# Patient Record
Sex: Male | Born: 1965 | Race: White | Hispanic: No | Marital: Married | Smoking: Never smoker
Health system: Southern US, Community
[De-identification: ages and names within clinical notes are randomized; demographics above are authoritative.]

## PROBLEM LIST (undated history)

## (undated) DIAGNOSIS — G4733 Obstructive sleep apnea (adult) (pediatric): Secondary | ICD-10-CM

## (undated) HISTORY — PX: CHOLECYSTECTOMY: SHX55

---

## 2019-12-22 ENCOUNTER — Other Ambulatory Visit: Payer: Self-pay

## 2019-12-22 ENCOUNTER — Encounter (HOSPITAL_BASED_OUTPATIENT_CLINIC_OR_DEPARTMENT_OTHER): Payer: Self-pay

## 2019-12-22 ENCOUNTER — Emergency Department (HOSPITAL_BASED_OUTPATIENT_CLINIC_OR_DEPARTMENT_OTHER)
Admission: EM | Admit: 2019-12-22 | Discharge: 2019-12-22 | Disposition: A | Discharge status: Home / Self Care | Payer: Commercial Managed Care - PPO | Attending: Emergency Medicine | Admitting: Emergency Medicine

## 2019-12-22 ENCOUNTER — Emergency Department (HOSPITAL_BASED_OUTPATIENT_CLINIC_OR_DEPARTMENT_OTHER): Payer: Commercial Managed Care - PPO

## 2019-12-22 DIAGNOSIS — M79605 Pain in left leg: Secondary | ICD-10-CM

## 2019-12-22 DIAGNOSIS — M25552 Pain in left hip: Secondary | ICD-10-CM | POA: Diagnosis present

## 2019-12-22 DIAGNOSIS — M79662 Pain in left lower leg: Secondary | ICD-10-CM | POA: Diagnosis not present

## 2019-12-22 HISTORY — DX: Obstructive sleep apnea (adult) (pediatric): G47.33

## 2019-12-22 LAB — CBC
HCT: 44.2 % (ref 39.0–52.0)
Hemoglobin: 14.9 g/dL (ref 13.0–17.0)
MCH: 30.2 pg (ref 26.0–34.0)
MCHC: 33.7 g/dL (ref 30.0–36.0)
MCV: 89.5 fL (ref 80.0–100.0)
Platelets: 174 10*3/uL (ref 150–400)
RBC: 4.94 MIL/uL (ref 4.22–5.81)
RDW: 12.1 % (ref 11.5–15.5)
WBC: 7 10*3/uL (ref 4.0–10.5)
nRBC: 0 % (ref 0.0–0.2)

## 2019-12-22 LAB — BASIC METABOLIC PANEL
Anion gap: 9 (ref 5–15)
BUN: 14 mg/dL (ref 6–20)
CO2: 25 mmol/L (ref 22–32)
Calcium: 8.7 mg/dL — ABNORMAL LOW (ref 8.9–10.3)
Chloride: 100 mmol/L (ref 98–111)
Creatinine, Ser: 0.79 mg/dL (ref 0.61–1.24)
GFR calc Af Amer: >60 mL/min (ref >60)
GFR calc non Af Amer: >60 mL/min (ref >60)
Glucose, Bld: 118 mg/dL — ABNORMAL HIGH (ref 70–99)
Potassium: 4 mmol/L (ref 3.5–5.1)
Sodium: 134 mmol/L — ABNORMAL LOW (ref 135–145)

## 2019-12-22 MED ORDER — IOHEXOL 350 MG/ML SOLN
100.0000 mL | Freq: Once | INTRAVENOUS | Status: AC | PRN
  Administered 2019-12-22: 100 mL via INTRAVENOUS

## 2019-12-22 MED ORDER — IBUPROFEN 600 MG PO TABS: 600.0000 mg | ORAL_TABLET | Freq: Four times a day (QID) | ORAL | 0 refills | Status: AC | PRN

## 2019-12-22 MED ORDER — METHYLPREDNISOLONE 4 MG PO TBPK
ORAL_TABLET | ORAL | 0 refills | Status: AC
Start: 2019-12-22 — End: ?

## 2019-12-22 MED ORDER — KETOROLAC TROMETHAMINE 30 MG/ML IJ SOLN
30.0000 mg | Freq: Once | INTRAMUSCULAR | Status: AC
  Administered 2019-12-22: 30 mg via INTRAVENOUS
  Filled 2019-12-22: qty 1

## 2019-12-22 MED ORDER — OXYCODONE-ACETAMINOPHEN 5-325 MG PO TABS: 2.0000 | ORAL_TABLET | Freq: Four times a day (QID) | ORAL | 0 refills | Status: AC | PRN

## 2019-12-22 NOTE — ED Notes (Signed)
Patient stated that the pain is worst than when he came.  Patient just got back from xray.

## 2019-12-22 NOTE — Discharge Instructions (Addendum)
Please follow up with your PCP in 1 week.  I've prescribed steroids, NSAIDS, and percocet for your pain.  Your condition may be a torn muscle, or else an inflamed nerve in your back, or another medical issue that we could not determine in the ER today.  Your CT scan today did not show any injuries or issues with your blood vessels, including your aorta in the abdomen or the major blood vessels in your legs.  This was my primary concern with your presentation.  Your doctor may wish to pursue additional testing including an MRI of your back if you are not improving.  Please bring your lab tests and CT report with you to the doctor's office on your next visit.

## 2019-12-22 NOTE — ED Notes (Signed)
Off unit for CT scan.

## 2019-12-22 NOTE — ED Triage Notes (Signed)
Pt arrives with c/o pain to left leg starting Saturday. Pt erepots he walks 2 miles everyday. States that hi [place] is in his left hip and radiates into left knee. Pt reports some numbness to left knee with pain.

## 2019-12-22 NOTE — ED Notes (Signed)
ED Provider at bedside. 

## 2019-12-22 NOTE — ED Provider Notes (Signed)
[facility] EMERGENCY DEPARTMENT Provider Note   CSN: [id number] Arrival date & time: 12/22/19  1018     History Chief Complaint  Patient presents with  . Leg Pain    Dale Williamson is a 54 y.o. male presenting to the emergency department with left hip and upper leg pain.  Patient reports that he had some aching in his bilateral legs 2 days ago, normally walks about 2 miles every day.  He went to bed last night feeling okay but woke up this morning with severe pain deep in his left hip.  He feels like it starts near the hip and radiates down to his mid thigh.  He says the pain was so severe he could not get out of bed.  He felt he could barely speak because the pain was so intense.  His wife reports he has never seen him in this much pain.  They tried some home remedies including ice packs and lidocaine jelly.  These have not provided much relief.    Patient feels that his pain is better when laying still.  It is worse when trying to move in with bearing weight.  Overall the pain is somewhat improved from its onset this morning., now a 7/10 instead of 10/10.  He has never had pain like this before.  He denies any history of back injuries, slipped disc, or spinal surgery.  He denies any known history of arthritis.  He denies any history of blood clots in his lower legs.  He denies any smoking history.  He denies any history of aneurysm in himself or his family.    HPI     Past Medical History:  Diagnosis Date  . Obstructive sleep apnea on CPAP     There are no problems to display for this patient.   Past Surgical History:  Procedure Laterality Date  . CHOLECYSTECTOMY         No family history on file.  Social History   Tobacco Use  . Smoking status: Never Smoker  . Smokeless tobacco: Never Used  Substance Use Topics  . Alcohol use: Not Currently  . Drug use: Not Currently    Home Medications Prior to Admission medications   Medication Sig Start Date End  Date Taking? Authorizing Provider  acetaminophen (TYLENOL) 500 MG tablet Take 1,000 mg by mouth every 6 (six) hours as needed.   Yes [provider]  ibuprofen (ADVIL) 600 MG tablet Take 1 tablet (600 mg total) by mouth every 6 (six) hours as needed for up to 30 doses. 12/22/19   Terald Sleeper, MD  methylPREDNISolone (MEDROL DOSEPAK) 4 MG TBPK tablet Use as directed 12/22/19   Terald Sleeper, MD  oxyCODONE-acetaminophen (PERCOCET/ROXICET) 5-325 MG tablet Take 2 tablets by mouth every 6 (six) hours as needed for up to 5 doses for severe pain. 12/22/19   Terald Sleeper, MD    Allergies    Patient has no known allergies.  Review of Systems   Review of Systems  Constitutional: Negative for chills and fever.  HENT: Negative for ear pain and sore throat.   Eyes: Negative for pain and visual disturbance.  Respiratory: Negative for cough and shortness of breath.   Cardiovascular: Negative for chest pain and palpitations.  Gastrointestinal: Negative for nausea and vomiting.  Genitourinary: Negative for dysuria and hematuria.  Musculoskeletal: Positive for arthralgias and myalgias.  Skin: Negative for color change and rash.  Neurological: Positive for numbness. Negative for syncope and  weakness.  Psychiatric/Behavioral: Negative for agitation and confusion.  All other systems reviewed and are negative.   Physical Exam Updated Vital Signs BP 113/76 (BP Location: Right Arm)   Pulse 69   Temp 97.9 F (36.6 C) (Oral)   Resp 19   Ht 5\' 7"  (1.702 m)   Wt 73.9 kg   SpO2 100%   BMI 25.53 kg/m   Physical Exam Vitals and nursing note reviewed.  Constitutional:      Appearance: He is well-developed.  HENT:     Head: Normocephalic and atraumatic.  Eyes:     Conjunctiva/sclera: Conjunctivae normal.  Cardiovascular:     Rate and Rhythm: Normal rate and regular rhythm.     Pulses: Normal pulses.     Heart sounds: No murmur heard.      Comments: Femoral and pedal pulses  equal bilaterally Pulmonary:     Effort: Pulmonary effort is normal. No respiratory distress.     Breath sounds: Normal breath sounds.  Abdominal:     General: There is no distension.     Palpations: Abdomen is soft.     Tenderness: There is no abdominal tenderness. There is no guarding.  Musculoskeletal:     Cervical back: Neck supple.     Comments: No unilateral swelling of the lower extremities No edema, bruising, or tenderness of the gluteal or lower extremity musculature  Skin:    General: Skin is warm and dry.  Neurological:     General: No focal deficit present.     Mental Status: He is alert and oriented to person, place, and time.     Comments: Paresthesias reported in lateral hip L1-L2 dermatome left side No saddle anesthesia 5/5 strength in lower extremities at the hip, knee, and ankle and toes Reflexes normal Negative straight leg test No spinal midline tenderness     ED Results / Procedures / Treatments   Labs (all labs ordered are listed, but only abnormal results are displayed) Labs Reviewed  BASIC METABOLIC PANEL - Abnormal; Notable for the following components:      Result Value   Sodium 134 (*)    Glucose, Bld 118 (*)    Calcium 8.7 (*)    All other components within normal limits  CBC    EKG None  Radiology CT ANGIO AO+BIFEM W & OR WO CONTRAST  Result Date: 12/22/2019 CLINICAL DATA:  54 year old with left leg pain. Need to rule out abdominal aortic aneurysm. EXAM: CT ANGIOGRAPHY OF ABDOMINAL AORTA WITH ILIOFEMORAL RUNOFF TECHNIQUE: Multidetector CT imaging of the abdomen, pelvis and lower extremities was performed using the standard protocol during bolus administration of intravenous contrast. Multiplanar CT image reconstructions and MIPs were obtained to evaluate the vascular anatomy. CONTRAST:  136mL OMNIPAQUE IOHEXOL 350 MG/ML SOLN COMPARISON:  None. FINDINGS: VASCULAR Aorta: Normal caliber aorta without aneurysm, dissection, vasculitis or  significant stenosis. Celiac: Patent without evidence of aneurysm, dissection, vasculitis or significant stenosis. SMA: Patent without evidence of aneurysm, dissection, vasculitis or significant stenosis. Renals: Both renal arteries are patent without evidence of aneurysm, dissection, vasculitis, fibromuscular dysplasia or significant stenosis. IMA: Patent without evidence of aneurysm, dissection, vasculitis or significant stenosis. RIGHT Lower Extremity Inflow: Common, internal and external iliac arteries are patent without evidence of aneurysm, dissection, vasculitis or significant stenosis. Outflow: Common, superficial and profunda femoral arteries and the popliteal artery are patent without evidence of aneurysm, dissection, vasculitis or significant stenosis. Runoff: Patent three vessel runoff to the ankle. LEFT Lower Extremity Inflow: Common, internal and  external iliac arteries are patent without evidence of aneurysm, dissection, vasculitis or significant stenosis. Outflow: Common, superficial and profunda femoral arteries and the popliteal artery are patent without evidence of aneurysm, dissection, vasculitis or significant stenosis. Runoff: Patent three vessel runoff to the ankle. Veins: No obvious venous abnormality within the limitations of this arterial phase study. Review of the MIP images confirms the above findings. NON-VASCULAR Lower chest: 4 mm pleural-based density in the posterior left lower lobe is probably an incidental finding and could represent atelectasis or tiny nodule. This is likely an incidental finding. No significant pleural fluid. Hepatobiliary: Cholecystectomy.  Normal appearance of the liver. Pancreas: Unremarkable. No pancreatic ductal dilatation or surrounding inflammatory changes. Spleen: Normal in size without focal abnormality. Adrenals/Urinary Tract: Normal appearance of the adrenal glands. Normal appearance of both kidneys without hydronephrosis or suspicious lesions. Urinary  bladder is unremarkable. Stomach/Bowel: Moderate to large amount of stool throughout the colon, particularly in the right colon and rectal region. No evidence for bowel obstruction or focal bowel inflammation. Normal appearance of stomach. Lymphatic: No significant abdominopelvic lymphadenopathy. Reproductive: Prostate is unremarkable. Other: Negative for ascites. Negative for free air. Small umbilical hernia containing fat. Musculoskeletal: Disc space narrowing at L5-S1. IMPRESSION: VASCULAR Normal arterial structures in the abdomen, pelvis and lower extremities. Arteries are widely patent without atherosclerotic disease, aneurysm, dissection or stenosis. NON-VASCULAR No acute abnormality in the abdomen or pelvis. Tiny pleural-based nodule in left lower lobe is indeterminate but likely an incidental finding. No follow-up needed if patient is low-risk. Non-contrast chest CT can be considered in 12 months if patient is high-risk. This recommendation follows the consensus statement: Guidelines for Management of Incidental Pulmonary Nodules Detected on CT Images: From the Fleischner Society 2017; Radiology 2017; 284:228-243. Cholecystectomy. Moderate to large stool burden. Electronically Signed   By: Richarda Overlie M.D.   On: 12/22/2019 13:32   DG Hip Unilat W or Wo Pelvis 2-3 Views Left  Result Date: 12/22/2019 CLINICAL DATA:  Onset left hip pain 12/20/2019.  No known injury. EXAM: DG HIP (WITH OR WITHOUT PELVIS) 2-3V LEFT COMPARISON:  None. FINDINGS: There is no evidence of hip fracture or dislocation. There is no evidence of arthropathy or other focal bone abnormality. IMPRESSION: Negative exam. Electronically Signed   By: Drusilla Kanner M.D.   On: 12/22/2019 11:56    Procedures Procedures (including critical care time)  Medications Ordered in ED Medications  ketorolac (TORADOL) 30 MG/ML injection 30 mg (30 mg Intravenous Given 12/22/19 1132)  iohexol (OMNIPAQUE) 350 MG/ML injection 100 mL (100 mLs  Intravenous Contrast Given 12/22/19 1223)    ED Course  I have reviewed the triage vital signs and the nursing notes.  Pertinent labs & imaging results that were available during my care of the patient were reviewed by me and considered in my medical decision making (see chart for details).  54 yo male presenting with onset of left hip and left lower leg pain beginning this morning.  He reports improvement while laying on his side on the affected hip, and improvement while lying still.  He reports some paresthesias in L1-L2 distribution, without anesthesia.  His neurological exam is benign.  I am doubtful of spinal cord compression  Ddx includes disc herniation vs sciatica vs small muscle injury or tear vs OA vs vascular injury (including dissection)  After a discussion with patient and his wife regarding the differential, we proceeded to obtain labs (BMP, CBC unremarkable) and xray of the left hip, which showed no significant arthritis  or evidence of fracture.  I discussed my concern for a vascular injury given that he had reported abdominal pain several days before, and was experiencing "deep" leg pain that I could not reproduce on muscular or ROM testing.  We agreed for a CTA, which was ultimately negative.  At this time, he was feeling better, and I felt we could trial a course of steroids and NSAIDS for presumed lumbar radiculopathy or nerve pain.  He can f/u with his PCP in 1 week.  I felt this was less likely a venous thrombosis with no risk factors for clotting and no evidence of swelling of the leg, or tenderness along the venous system.  Clinical Course as of Dec 22 1742  Mon Dec 22, 2019  1228 X-rays are unremarkable patient feels that he is now having paresthesias extending from the hip down to his left lower leg.  This point I would like to proceed with CT angio study to evaluate his vasculature, and they are in agreement (patient and his wife).   [MT]  1428 Patient's work-up is  unremarkable.  Discussed with him the constipation on CT scan, he has regular bowel movements with did not have one today.  I am doubtful this is causing his pain.  I think is more likely he is having sciatica or nerve issue in his back, or else he has a small tear in one of his muscles.  We discussed a weeklong course of prednisone, as well as anti-inflammatories.  He was set up a follow-up appointment with his doctor in 1 week, at which point they can decide collectively whether to pursue an MRI.  He is ambulatory here.  His pain is under control.  I think it is reasonable for discharge home   [MT]    Clinical Course User Index [MT] Indica Marcott, Kermit Balo, MD    Final Clinical Impression(s) / ED Diagnoses Final diagnoses:  Left leg pain  Left hip pain    Rx / DC Orders ED Discharge Orders         Ordered    methylPREDNISolone (MEDROL DOSEPAK) 4 MG TBPK tablet     Discontinue  Reprint     12/22/19 1433    ibuprofen (ADVIL) 600 MG tablet  Every 6 hours PRN     Discontinue  Reprint     12/22/19 1433    oxyCODONE-acetaminophen (PERCOCET/ROXICET) 5-325 MG tablet  Every 6 hours PRN     Discontinue  Reprint     12/22/19 1433           Daelin Haste, Kermit Balo, MD 12/22/19 1744

## 2020-06-04 ENCOUNTER — Ambulatory Visit: Payer: Self-pay | Attending: Internal Medicine

## 2020-06-04 DIAGNOSIS — Z23 Encounter for immunization: Secondary | ICD-10-CM

## 2020-06-04 NOTE — Progress Notes (Signed)
   Covid-19 Vaccination Clinic  Name:  Dale Williamson    MRN: [id number] DOB: 1966-02-14  06/04/2020  [name] was observed post Covid-19 immunization for 15 minutes without incident. He was provided with Vaccine Information Sheet and instruction to access the V-Safe system.   [name] was instructed to call 911 with any severe reactions post vaccine: Marland Kitchen Difficulty breathing  . Swelling of face and throat  . A fast heartbeat  . A bad rash all over body  . Dizziness and weakness   Immunizations Administered    Name Date Dose VIS Date Route   Pfizer COVID-19 Vaccine 06/04/2020  3:38 PM 0.3 mL 04/21/2020 Intramuscular   Manufacturer: [employer], [employer]   Lot: [id number]   NDC: [address]-6

## 2021-05-23 ENCOUNTER — Ambulatory Visit: Payer: Self-pay | Attending: Internal Medicine

## 2021-05-23 DIAGNOSIS — Z23 Encounter for immunization: Secondary | ICD-10-CM

## 2021-05-23 NOTE — Progress Notes (Signed)
   Covid-19 Vaccination Clinic  Name:  Lando Alcalde    MRN: [id number] DOB: 11-Aug-1965  05/23/2021  Mr. Siebert was observed post Covid-19 immunization for 15 minutes without incident. He was provided with Vaccine Information Sheet and instruction to access the V-Safe system.   Mr. Bostwick was instructed to call 911 with any severe reactions post vaccine: Difficulty breathing  Swelling of face and throat  A fast heartbeat  A bad rash all over body  Dizziness and weakness   Immunizations Administered     Name Date Dose VIS Date Route   Pfizer Covid-19 Vaccine Bivalent Booster 05/23/2021  2:14 PM 0.3 mL 03/02/2021 Intramuscular   Manufacturer: [employer], [employer]   Lot: [id number]   NDC: [phone]

## 2021-06-04 ENCOUNTER — Other Ambulatory Visit (HOSPITAL_BASED_OUTPATIENT_CLINIC_OR_DEPARTMENT_OTHER): Payer: Self-pay

## 2021-06-04 MED ORDER — PFIZER COVID-19 VAC BIVALENT 30 MCG/0.3ML IM SUSP
INTRAMUSCULAR | 0 refills | Status: AC
  Filled 2021-06-04: qty 0.3, 1d supply, fill #0

## 2021-06-06 ENCOUNTER — Other Ambulatory Visit (HOSPITAL_BASED_OUTPATIENT_CLINIC_OR_DEPARTMENT_OTHER): Payer: Self-pay

## 2021-07-23 IMAGING — CT CT ANGIO AOBIFEM WO/W CM
2 of 12 series · 11 of 46 positions shown, 15 images · IV contrast (omnipaque)
Comparison: None.

CLINICAL DATA: 53-year-old with left leg pain. Need to rule out
abdominal aortic aneurysm.

EXAM:
CT ANGIOGRAPHY OF ABDOMINAL AORTA WITH ILIOFEMORAL RUNOFF
TECHNIQUE: Multidetector CT imaging of the abdomen, pelvis and lower
extremities was performed using the standard protocol during bolus
administration of intravenous contrast. Multiplanar CT image
reconstructions and MIPs were obtained to evaluate the vascular
anatomy.
CONTRAST:  100mL OMNIPAQUE IOHEXOL 350 MG/ML SOLN

[Series 6: runoff axial arterial · axial · arterial · 0.91mm/px · z∈[-528,+186]mm · 10 of 290 slices shown, 13 images]
[im 35/290  soft-tissue]
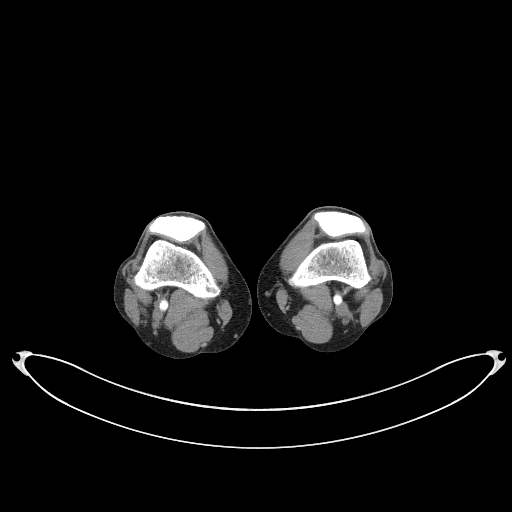
[im 35/290  bone]
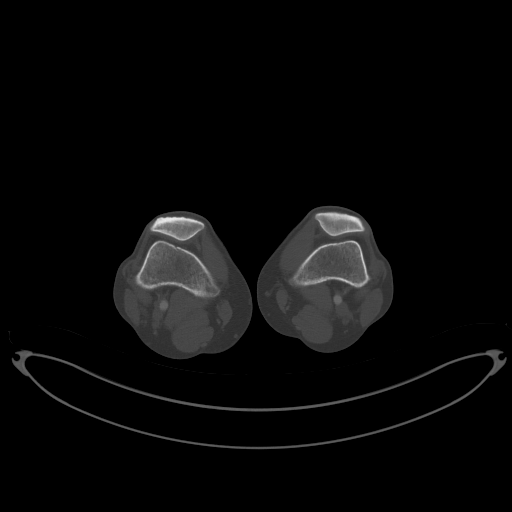
[im 69/290  soft-tissue]
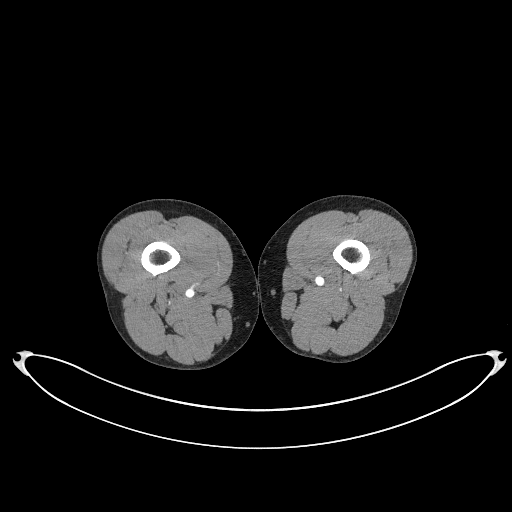
[im 103/290  soft-tissue]
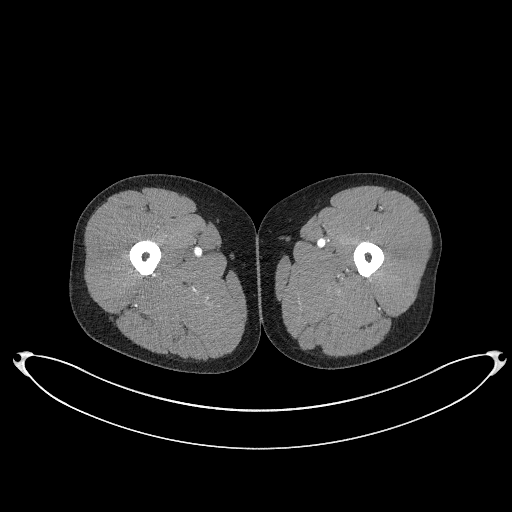
[im 137/290  soft-tissue]
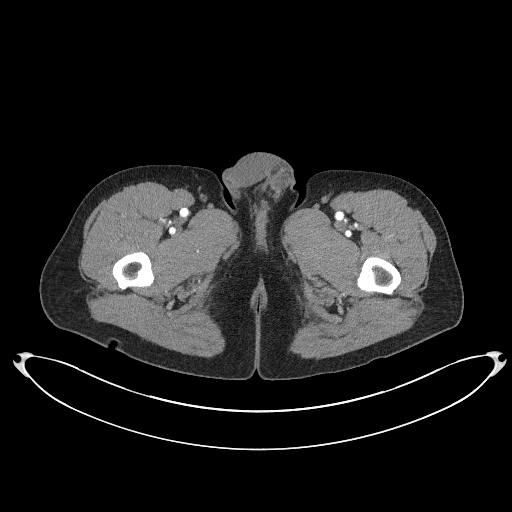
[im 171/290  soft-tissue]
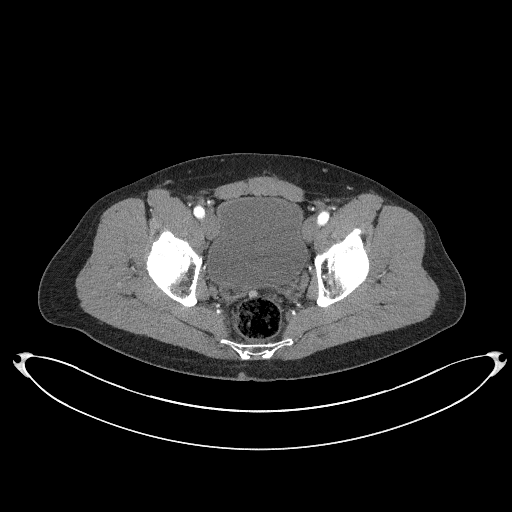
[im 205/290  soft-tissue]
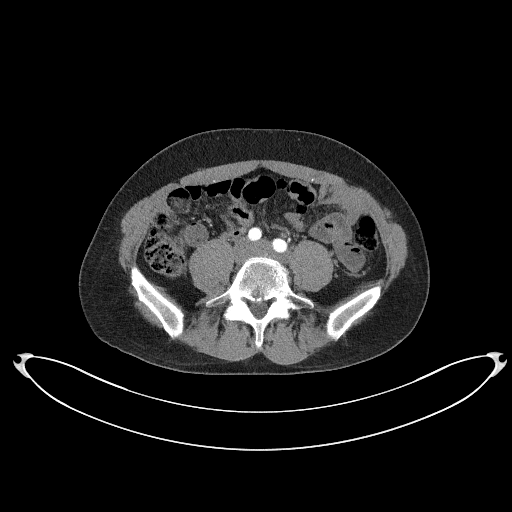
[im 222/290  lung]
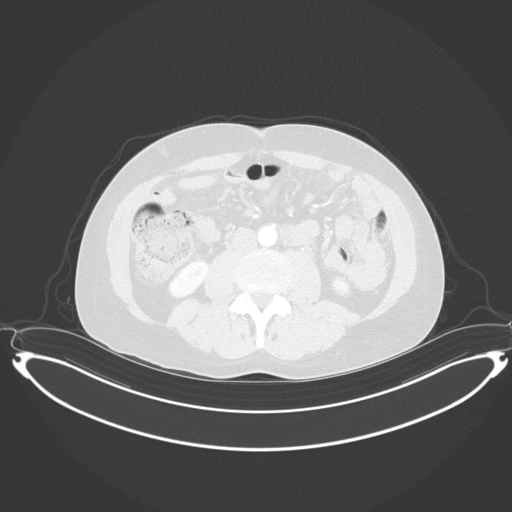
[im 239/290  soft-tissue]
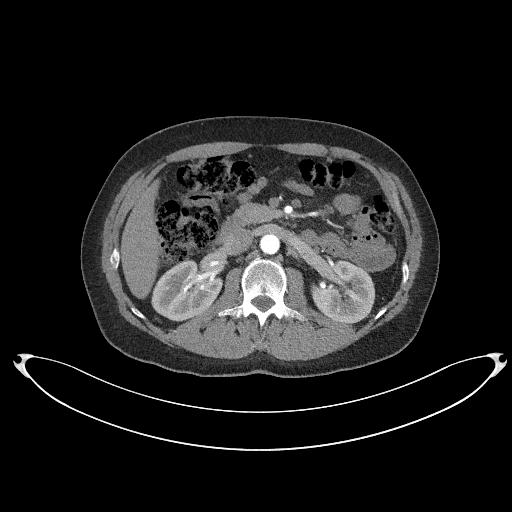
[im 239/290  lung]
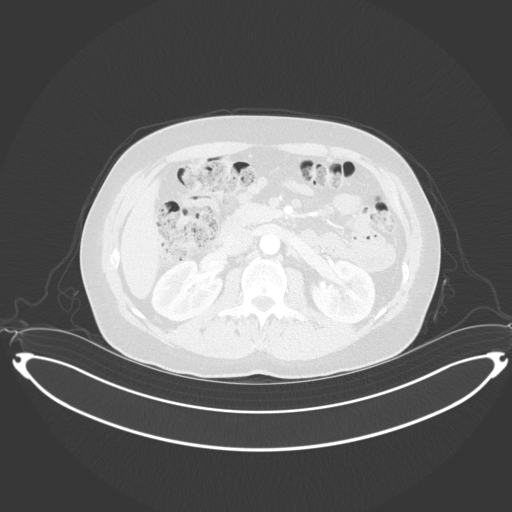
[im 256/290  lung]
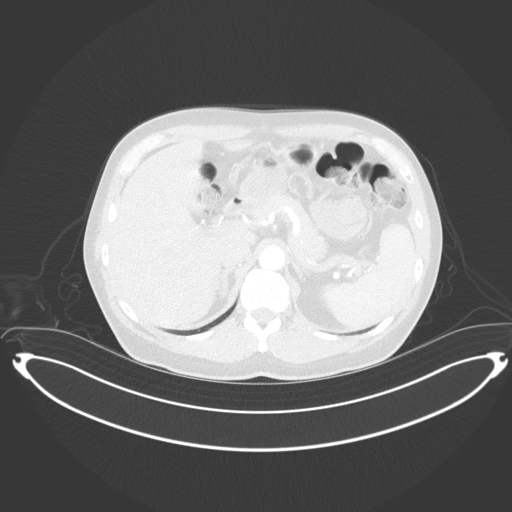
[im 273/290  soft-tissue]
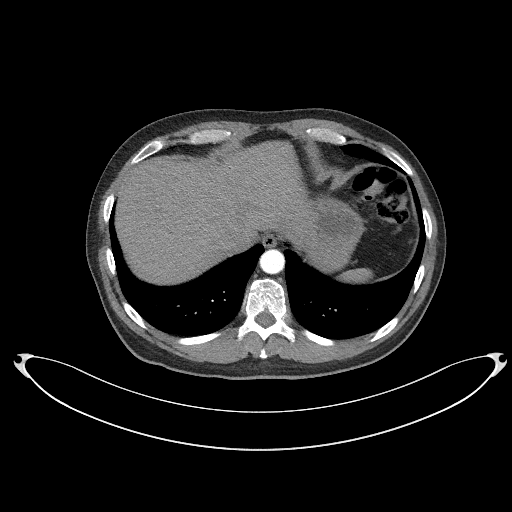
[im 273/290  lung]
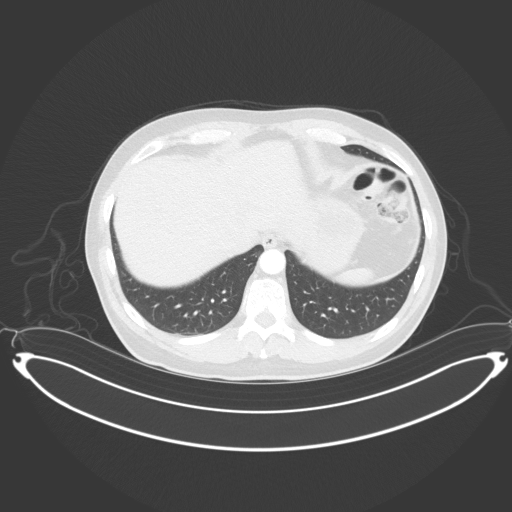

[Series 8: coronal upper · coronal · 0.90mm/px · 1 of 154 slices shown, 2 images]
[im 77/154  soft-tissue]
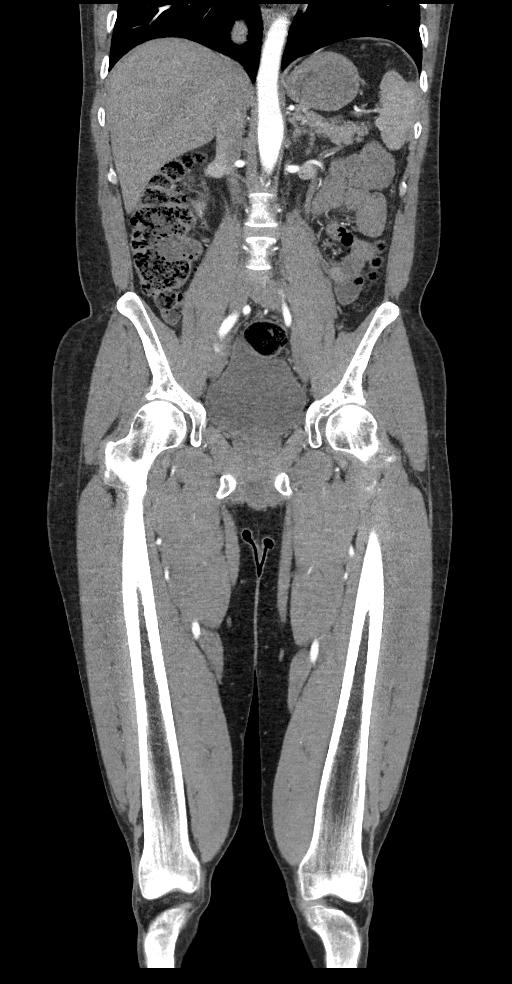
[im 77/154  bone]
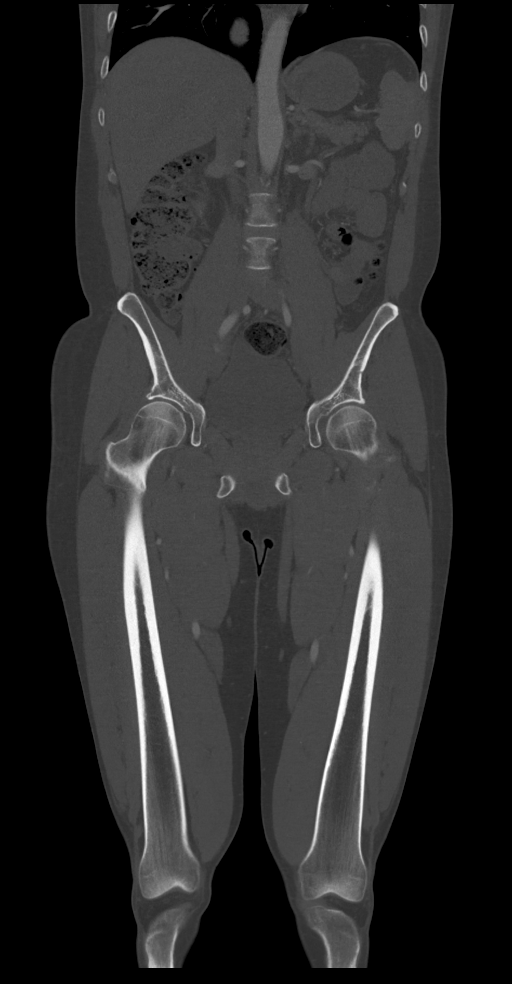

[11 of 46 positions shown; findings below may reference images not displayed]

FINDINGS: VASCULAR

Aorta: Normal caliber aorta without aneurysm, dissection, vasculitis
or significant stenosis.

Celiac: Patent without evidence of aneurysm, dissection, vasculitis
or significant stenosis.

SMA: Patent without evidence of aneurysm, dissection, vasculitis or
significant stenosis.

Renals: Both renal arteries are patent without evidence of aneurysm,
dissection, vasculitis, fibromuscular dysplasia or significant
stenosis.

IMA: Patent without evidence of aneurysm, dissection, vasculitis or
significant stenosis.

RIGHT Lower Extremity

Inflow: Common, internal and external iliac arteries are patent
without evidence of aneurysm, dissection, vasculitis or significant
stenosis.

Outflow: Common, superficial and profunda femoral arteries and the
popliteal artery are patent without evidence of aneurysm,
dissection, vasculitis or significant stenosis.

Runoff: Patent three vessel runoff to the ankle.

LEFT Lower Extremity

Inflow: Common, internal and external iliac arteries are patent
without evidence of aneurysm, dissection, vasculitis or significant
stenosis.

Outflow: Common, superficial and profunda femoral arteries and the
popliteal artery are patent without evidence of aneurysm,
dissection, vasculitis or significant stenosis.

Runoff: Patent three vessel runoff to the ankle.

Veins: No obvious venous abnormality within the limitations of this
arterial phase study.

Review of the MIP images confirms the above findings.

NON-VASCULAR

Lower chest: 4 mm pleural-based density in the posterior left lower
lobe is probably an incidental finding and could represent
atelectasis or tiny nodule. This is likely an incidental finding. No
significant pleural fluid.

Hepatobiliary: Cholecystectomy.  Normal appearance of the liver.

Pancreas: Unremarkable. No pancreatic ductal dilatation or
surrounding inflammatory changes.

Spleen: Normal in size without focal abnormality.

Adrenals/Urinary Tract: Normal appearance of the adrenal glands.
Normal appearance of both kidneys without hydronephrosis or
suspicious lesions. Urinary bladder is unremarkable.

Stomach/Bowel: Moderate to large amount of stool throughout the
colon, particularly in the right colon and rectal region. No
evidence for bowel obstruction or focal bowel inflammation. Normal
appearance of stomach.

Lymphatic: No significant abdominopelvic lymphadenopathy.

Reproductive: Prostate is unremarkable.

Other: Negative for ascites. Negative for free air. Small umbilical
hernia containing fat.

Musculoskeletal: Disc space narrowing at L5-S1.
IMPRESSION: VASCULAR

Normal arterial structures in the abdomen, pelvis and lower
extremities. Arteries are widely patent without atherosclerotic
disease, aneurysm, dissection or stenosis.

NON-VASCULAR

No acute abnormality in the abdomen or pelvis.

Tiny pleural-based nodule in left lower lobe is indeterminate but
likely an incidental finding. No follow-up needed if patient is
low-risk. Non-contrast chest CT can be considered in 12 months if
patient is high-risk. This recommendation follows the consensus
statement: Guidelines for Management of Incidental Pulmonary Nodules
Detected on CT Images: From the [HOSPITAL] 7124; Radiology
7124; [DATE].

Cholecystectomy.

Moderate to large stool burden.

## 2022-05-12 ENCOUNTER — Other Ambulatory Visit (HOSPITAL_BASED_OUTPATIENT_CLINIC_OR_DEPARTMENT_OTHER): Payer: Self-pay

## 2022-05-12 MED ORDER — COMIRNATY 30 MCG/0.3ML IM SUSY
PREFILLED_SYRINGE | INTRAMUSCULAR | 0 refills | Status: AC
  Filled 2022-05-12: qty 0.3, 1d supply, fill #0

## 2023-04-27 ENCOUNTER — Other Ambulatory Visit (HOSPITAL_BASED_OUTPATIENT_CLINIC_OR_DEPARTMENT_OTHER): Payer: Self-pay

## 2023-04-27 MED ORDER — INFLUENZA VIRUS VACC SPLIT PF (FLUZONE) 0.5 ML IM SUSY
0.5000 mL | PREFILLED_SYRINGE | Freq: Once | INTRAMUSCULAR | 0 refills | Status: AC
  Filled 2023-04-27: qty 0.5, 1d supply, fill #0

## 2024-04-07 ENCOUNTER — Other Ambulatory Visit (HOSPITAL_BASED_OUTPATIENT_CLINIC_OR_DEPARTMENT_OTHER): Payer: Self-pay

## 2024-04-07 MED ORDER — FLUZONE 0.5 ML IM SUSY
0.5000 mL | PREFILLED_SYRINGE | Freq: Once | INTRAMUSCULAR | 0 refills | Status: AC
  Filled 2024-04-07: qty 0.5, 1d supply, fill #0

## 2024-06-04 ENCOUNTER — Other Ambulatory Visit (HOSPITAL_BASED_OUTPATIENT_CLINIC_OR_DEPARTMENT_OTHER): Payer: Self-pay

## 2024-06-04 MED ORDER — COMIRNATY 30 MCG/0.3ML IM SUSY
0.3000 mL | PREFILLED_SYRINGE | Freq: Once | INTRAMUSCULAR | 0 refills | Status: AC
  Filled 2024-06-04: qty 0.3, 1d supply, fill #0
